# Patient Record
Sex: Male | Born: 1955 | Race: White | Hispanic: No | Marital: Married | Smoking: Former smoker
Health system: Southern US, Community
[De-identification: ages and names within clinical notes are randomized; demographics above are authoritative.]

## PROBLEM LIST (undated history)

## (undated) DIAGNOSIS — N200 Calculus of kidney: Secondary | ICD-10-CM

## (undated) DIAGNOSIS — I10 Essential (primary) hypertension: Secondary | ICD-10-CM

## (undated) DIAGNOSIS — K219 Gastro-esophageal reflux disease without esophagitis: Secondary | ICD-10-CM

## (undated) HISTORY — PX: APPENDECTOMY: SHX54

---

## 2011-05-13 ENCOUNTER — Emergency Department (HOSPITAL_COMMUNITY)
Admission: EM | Admit: 2011-05-13 | Discharge: 2011-05-13 | Disposition: A | Discharge status: Home / Self Care | Payer: No Typology Code available for payment source | Attending: Emergency Medicine | Admitting: Emergency Medicine

## 2011-05-13 ENCOUNTER — Emergency Department (HOSPITAL_COMMUNITY): Payer: No Typology Code available for payment source

## 2011-05-13 ENCOUNTER — Encounter: Payer: Self-pay | Admitting: Emergency Medicine

## 2011-05-13 ENCOUNTER — Other Ambulatory Visit: Payer: Self-pay

## 2011-05-13 DIAGNOSIS — Z87442 Personal history of urinary calculi: Secondary | ICD-10-CM | POA: Insufficient documentation

## 2011-05-13 DIAGNOSIS — R0602 Shortness of breath: Secondary | ICD-10-CM | POA: Insufficient documentation

## 2011-05-13 DIAGNOSIS — R071 Chest pain on breathing: Secondary | ICD-10-CM | POA: Insufficient documentation

## 2011-05-13 DIAGNOSIS — R079 Chest pain, unspecified: Secondary | ICD-10-CM

## 2011-05-13 DIAGNOSIS — K219 Gastro-esophageal reflux disease without esophagitis: Secondary | ICD-10-CM | POA: Insufficient documentation

## 2011-05-13 DIAGNOSIS — M549 Dorsalgia, unspecified: Secondary | ICD-10-CM | POA: Insufficient documentation

## 2011-05-13 DIAGNOSIS — M25519 Pain in unspecified shoulder: Secondary | ICD-10-CM | POA: Insufficient documentation

## 2011-05-13 HISTORY — DX: Gastro-esophageal reflux disease without esophagitis: K21.9

## 2011-05-13 HISTORY — DX: Calculus of kidney: N20.0

## 2011-05-13 MED ORDER — DIAZEPAM 5 MG PO TABS: 5.0000 mg | ORAL_TABLET | Freq: Two times a day (BID) | ORAL | Status: AC

## 2011-05-13 NOTE — ED Notes (Signed)
Patient has had back pain described as pressure for 3 days that radiates to chest, chest is described as being tender, cannot sleep on back, stomach or either side

## 2011-05-13 NOTE — ED Provider Notes (Signed)
History     CSN: [id number] Arrival date & time: 05/13/2011 12:51 PM   First MD Initiated Contact with Patient 05/13/11 1303      Chief Complaint  Patient presents with  . Back Pain    (Consider location/radiation/quality/duration/timing/severity/associated sxs/prior treatment) HPI Comments: Patient presents with 2-3 days of left anterior lateral and posterior chest wall pain.  Patient notes that he feels that his left upper chest is tender to palpation.  He feels that it is swollen although there is no redness and no fevers.  Patient states that the pain goes through to his left shoulder blade.  No known new injuries or lifting or new exercises.  Patient describes intermittent mild shortness of breath but the pain is not worse with taking big breaths.  He notes the pain is worse on sitting straight up or lying down.  He notes that it is not worse with exertion and actually the more he moves the better he feels.  He denies any prior cardiac history.  Patient is a 55 y.o. male presenting with back pain. The history is provided by the patient. No language interpreter was used.  Back Pain  This is a new problem. The current episode started more than 2 days ago. The problem occurs constantly. The problem has not changed since onset.The pain is associated with no known injury. The pain is mild. The symptoms are aggravated by certain positions. Associated symptoms include chest pain. Pertinent negatives include no fever, no headaches and no abdominal pain.    Past Medical History  Diagnosis Date  . Acid reflux   . Kidney stones     Past Surgical History  Procedure Date  . Appendectomy     No family history on file.  History  Substance Use Topics  . Smoking status: Former Games developer  . Smokeless tobacco: Not on file  . Alcohol Use: No      Review of Systems  Constitutional: Negative.  Negative for fever and chills.  HENT: Negative.   Eyes: Negative.  Negative for discharge and  redness.  Respiratory: Positive for shortness of breath. Negative for cough.   Cardiovascular: Positive for chest pain.  Gastrointestinal: Negative.  Negative for nausea, vomiting and abdominal pain.  Genitourinary: Negative.  Negative for hematuria.  Musculoskeletal: Positive for back pain.  Skin: Negative.  Negative for color change and rash.  Neurological: Negative for syncope and headaches.  Hematological: Negative.  Negative for adenopathy.  Psychiatric/Behavioral: Negative.  Negative for confusion.  All other systems reviewed and are negative.    Allergies  Celecoxib  Home Medications   Current Outpatient Rx  Name Route Sig Dispense Refill  . OMEPRAZOLE 20 MG PO CPDR Oral Take 20 mg by mouth daily.        BP 175/91  Pulse 67  Temp(Src) 97 F (36.1 C) (Oral)  Resp 16  Ht 5\' 11"  (1.803 m)  Wt 196 lb 8 oz (89.132 kg)  BMI 27.41 kg/m2  SpO2 99%  Physical Exam  Constitutional: He is oriented to person, place, and time. He appears well-developed and well-nourished.  Non-toxic appearance. He does not have a sickly appearance.  HENT:  Head: Normocephalic and atraumatic.  Eyes: Conjunctivae, EOM and lids are normal. Pupils are equal, round, and reactive to light.  Neck: Trachea normal, normal range of motion and full passive range of motion without pain. Neck supple.  Cardiovascular: Normal rate, regular rhythm and normal heart sounds.   Pulmonary/Chest: Effort normal and breath sounds normal.  No respiratory distress.  Abdominal: Soft. Normal appearance. He exhibits no distension. There is no tenderness. There is no rebound and no CVA tenderness.  Musculoskeletal: Normal range of motion.  Neurological: He is alert and oriented to person, place, and time. He has normal strength.  Skin: Skin is warm, dry and intact. No rash noted.  Psychiatric: He has a normal mood and affect. His behavior is normal. Judgment and thought content normal.    ED Course  Procedures (including  critical care time)  Labs Reviewed - No data to display No results found.   No diagnosis found.    Date: 05/13/2011  Rate: 60  Rhythm: normal sinus rhythm  QRS Axis: normal  Intervals: normal  ST/T Wave abnormalities: normal  Conduction Disutrbances:none  Narrative Interpretation:   Old EKG Reviewed: none available   MDM  Patient's chest x-ray visualized by myself along with the radiologist and found to be normal.  At this point in time I have no clear etiology for the patient's pain beyond a musculoskeletal cause.  He has no findings consistent with infection such as cellulitis or abscess.  He has no crepitus on his exam.  He has no findings for pneumonia.  He has symptoms that are not consistent with ACS and a normal EKG.  He is also low risk for ACS.  Patient has no pneumothorax.  He does not have symptoms consistent with pulmonary embolus.  He does not have symptoms consistent with aortic dissection given the description of his pain and notably does have equal blood pressures in both arms.  Patient will be given a prescription for muscle relaxants to assist him with sleep and has been instructed to followup with his primary care physician next week particularly if the symptoms remain persistent.        Nat Christen, MD 05/13/11 [phone]

## 2013-10-10 ENCOUNTER — Ambulatory Visit
Admission: RE | Admit: 2013-10-10 | Discharge: 2013-10-10 | Disposition: A | Discharge status: Home / Self Care | Payer: No Typology Code available for payment source | Source: Ambulatory Visit | Attending: Family Medicine | Admitting: Family Medicine

## 2013-10-10 ENCOUNTER — Other Ambulatory Visit: Payer: Self-pay | Admitting: Family Medicine

## 2013-10-10 DIAGNOSIS — R079 Chest pain, unspecified: Secondary | ICD-10-CM

## 2013-10-16 ENCOUNTER — Ambulatory Visit (HOSPITAL_COMMUNITY)
Admission: RE | Admit: 2013-10-16 | Discharge: 2013-10-16 | Disposition: A | Discharge status: Home / Self Care | Payer: No Typology Code available for payment source | Source: Ambulatory Visit | Attending: Cardiology | Admitting: Cardiology

## 2013-10-16 ENCOUNTER — Other Ambulatory Visit (HOSPITAL_COMMUNITY): Payer: Self-pay | Admitting: Family Medicine

## 2013-10-16 DIAGNOSIS — R079 Chest pain, unspecified: Secondary | ICD-10-CM | POA: Insufficient documentation

## 2015-01-05 ENCOUNTER — Emergency Department (HOSPITAL_COMMUNITY): Payer: No Typology Code available for payment source

## 2015-01-05 ENCOUNTER — Emergency Department (HOSPITAL_COMMUNITY)
Admission: EM | Admit: 2015-01-05 | Discharge: 2015-01-05 | Disposition: A | Discharge status: Home / Self Care | Payer: No Typology Code available for payment source | Attending: Emergency Medicine | Admitting: Emergency Medicine

## 2015-01-05 ENCOUNTER — Encounter (HOSPITAL_COMMUNITY): Payer: Self-pay | Admitting: *Deleted

## 2015-01-05 DIAGNOSIS — S299XXA Unspecified injury of thorax, initial encounter: Secondary | ICD-10-CM | POA: Diagnosis present

## 2015-01-05 DIAGNOSIS — I10 Essential (primary) hypertension: Secondary | ICD-10-CM | POA: Diagnosis not present

## 2015-01-05 DIAGNOSIS — K219 Gastro-esophageal reflux disease without esophagitis: Secondary | ICD-10-CM | POA: Diagnosis not present

## 2015-01-05 DIAGNOSIS — Z9089 Acquired absence of other organs: Secondary | ICD-10-CM | POA: Insufficient documentation

## 2015-01-05 DIAGNOSIS — S301XXA Contusion of abdominal wall, initial encounter: Secondary | ICD-10-CM | POA: Insufficient documentation

## 2015-01-05 DIAGNOSIS — W108XXA Fall (on) (from) other stairs and steps, initial encounter: Secondary | ICD-10-CM | POA: Diagnosis not present

## 2015-01-05 DIAGNOSIS — Z87442 Personal history of urinary calculi: Secondary | ICD-10-CM | POA: Diagnosis not present

## 2015-01-05 DIAGNOSIS — Z79899 Other long term (current) drug therapy: Secondary | ICD-10-CM | POA: Insufficient documentation

## 2015-01-05 DIAGNOSIS — Y9289 Other specified places as the place of occurrence of the external cause: Secondary | ICD-10-CM | POA: Insufficient documentation

## 2015-01-05 DIAGNOSIS — S20221A Contusion of right back wall of thorax, initial encounter: Secondary | ICD-10-CM | POA: Diagnosis not present

## 2015-01-05 DIAGNOSIS — W19XXXA Unspecified fall, initial encounter: Secondary | ICD-10-CM

## 2015-01-05 DIAGNOSIS — Y998 Other external cause status: Secondary | ICD-10-CM | POA: Diagnosis not present

## 2015-01-05 DIAGNOSIS — Z87891 Personal history of nicotine dependence: Secondary | ICD-10-CM | POA: Diagnosis not present

## 2015-01-05 DIAGNOSIS — Y9301 Activity, walking, marching and hiking: Secondary | ICD-10-CM | POA: Diagnosis not present

## 2015-01-05 DIAGNOSIS — S5011XA Contusion of right forearm, initial encounter: Secondary | ICD-10-CM | POA: Diagnosis not present

## 2015-01-05 DIAGNOSIS — S50811A Abrasion of right forearm, initial encounter: Secondary | ICD-10-CM | POA: Diagnosis not present

## 2015-01-05 DIAGNOSIS — S40021A Contusion of right upper arm, initial encounter: Secondary | ICD-10-CM

## 2015-01-05 HISTORY — DX: Essential (primary) hypertension: I10

## 2015-01-05 MED ORDER — HYDROCODONE-ACETAMINOPHEN 5-325 MG PO TABS: 1.0000 | ORAL_TABLET | Freq: Four times a day (QID) | ORAL | Status: AC | PRN

## 2015-01-05 NOTE — ED Notes (Signed)
Patient states he was walking on wet steps and he slipped and fell down the steps.  He denies loc.  He states it knocked the breath out of him.  Patient has right arm pain and right sided back pain.  Patient denies any sob.  Patient took ibuprofen this morning.

## 2015-01-05 NOTE — Discharge Instructions (Signed)
Contusion °A contusion is a deep bruise. Contusions are the result of an injury that caused bleeding under the skin. The contusion may turn blue, purple, or yellow. Minor injuries will give you a painless contusion, but more severe contusions may stay painful and swollen for a few weeks.  °CAUSES  °A contusion is usually caused by a blow, trauma, or direct force to an area of the body. °SYMPTOMS  °· Swelling and redness of the injured area. °· Bruising of the injured area. °· Tenderness and soreness of the injured area. °· Pain. °DIAGNOSIS  °The diagnosis can be made by taking a history and physical exam. An X-ray, CT scan, or MRI may be needed to determine if there were any associated injuries, such as fractures. °TREATMENT  °Specific treatment will depend on what area of the body was injured. In general, the best treatment for a contusion is resting, icing, elevating, and applying cold compresses to the injured area. Over-the-counter medicines may also be recommended for pain control. Ask your caregiver what the best treatment is for your contusion. °HOME CARE INSTRUCTIONS  °· Put ice on the injured area. °¨ Put ice in a plastic bag. °¨ Place a towel between your skin and the bag. °¨ Leave the ice on for 15-20 minutes, 3-4 times a day, or as directed by your health care provider. °· Only take over-the-counter or prescription medicines for pain, discomfort, or fever as directed by your caregiver. Your caregiver may recommend avoiding anti-inflammatory medicines (aspirin, ibuprofen, and naproxen) for 48 hours because these medicines may increase bruising. °· Rest the injured area. °· If possible, elevate the injured area to reduce swelling. °SEEK IMMEDIATE MEDICAL CARE IF:  °· You have increased bruising or swelling. °· You have pain that is getting worse. °· Your swelling or pain is not relieved with medicines. °MAKE SURE YOU:  °· Understand these instructions. °· Will watch your condition. °· Will get help right  away if you are not doing well or get worse. °Document Released: 02/23/2005 Document Revised: 05/21/2013 Document Reviewed: 03/21/2011 °[employer]® Patient Information ©2015 [employer]. This information is not intended to replace advice given to you by your health care provider. Make sure you discuss any questions you have with your health care provider. ° °

## 2015-01-05 NOTE — ED Provider Notes (Signed)
CSN: [id number]     Arrival date & time 01/05/15  4098 History   First MD Initiated Contact with Patient 01/05/15 [phone]     Chief Complaint  Patient presents with  . Fall  . Back Pain  . Arm Pain     Patient is a 59 y.o. male presenting with fall, back pain, and arm pain. The history is provided by the patient. No language interpreter was used.  Fall  Back Pain Arm Pain   Mr. Godden presents for evaluation of injuries following a fall. He was walking outside on some wet steps and he slipped on the top step and fell onto his back sliding down 7 steps. He denies any head injury or loss of consciousness. He has pain throughout his mid back. He felt like the wind got knocked out of him initially. He denies any chest pain, abdominal pain, vomiting, numbness, weakness. He takes no blood thinners. Symptoms are moderate and constant.  Past Medical History  Diagnosis Date  . Acid reflux   . Kidney stones   . Hypertension    Past Surgical History  Procedure Laterality Date  . Appendectomy     No family history on file. History  Substance Use Topics  . Smoking status: Former Games developer  . Smokeless tobacco: Not on file  . Alcohol Use: No    Review of Systems  Musculoskeletal: Positive for back pain.  All other systems reviewed and are negative.     Allergies  Celebrex  Home Medications   Prior to Admission medications   Medication Sig Start Date End Date Taking? Authorizing Provider  bisoprolol-hydrochlorothiazide (ZIAC) 5-6.25 MG per tablet Take 1 tablet by mouth daily.  01/01/15  Yes Historical Provider, MD  esomeprazole (NEXIUM) 20 MG capsule Take 20 mg by mouth daily at 12 noon.   Yes Historical Provider, MD  ibuprofen (ADVIL,MOTRIN) 200 MG tablet Take 200 mg by mouth every 6 (six) hours as needed for mild pain.   Yes Historical Provider, MD   BP 172/94 mmHg  Pulse 55  Temp(Src) 98.5 F (36.9 C) (Oral)  Resp 18  SpO2 99% Physical Exam  Constitutional: He is oriented to  person, place, and time. He appears well-developed and well-nourished.  HENT:  Head: Normocephalic and atraumatic.  Cardiovascular: Normal rate and regular rhythm.   No murmur heard. Pulmonary/Chest: Effort normal and breath sounds normal. No respiratory distress. He exhibits no tenderness.  Abdominal: Soft. There is no tenderness. There is no rebound and no guarding.  Musculoskeletal:  Abrasion and ecchymosis over the right ulnar forearm. There is abrasion and tenderness over the right flank. There is no discrete bony tenderness over the C, T, L-spine. No bony tenderness over the arm.  Neurological: He is alert and oriented to person, place, and time.  5 out of 5 strength in all 4 extermities  Skin: Skin is warm and dry.  Psychiatric: He has a normal mood and affect. His behavior is normal.  Nursing note and vitals reviewed.   ED Course  Procedures (including critical care time) Labs Review Labs Reviewed - No data to display  Imaging Review Dg Chest 2 View  01/05/2015   CLINICAL DATA:  Slipped and fell.  EXAM: CHEST  2 VIEW  COMPARISON:  10/10/2013  FINDINGS: The heart size and mediastinal contours are within normal limits. Both lungs are clear. The visualized skeletal structures are unremarkable.  IMPRESSION: No active cardiopulmonary disease.   Electronically Signed   By: Veronda Prude.D.  On: 01/05/2015 09:30   Dg Lumbar Spine Complete  01/05/2015   CLINICAL DATA:  Larey Seat down on wet steps, right side back pain  EXAM: LUMBAR SPINE - COMPLETE 4+ VIEW  COMPARISON:  06/24/2010  FINDINGS: Five views of lumbar spine submitted. No acute fracture or subluxation. Mild disc space flattening with anterior spurring at L3-L4 level. Moderate disc space flattening with mild anterior spurring at L5-S1 level.  IMPRESSION: No acute fracture or subluxation. Mild degenerative changes as described above.   Electronically Signed   By: Natasha Mead M.D.   On: 01/05/2015 09:19     EKG Interpretation None       MDM   Final diagnoses:  Fall, initial encounter  Contusion of flank and back, initial encounter  Arm contusion, right, initial encounter    Patient here for evaluation of injuries following a fall. Examination is not consistent with acute fracture or neurologic injury. Patient does have abrasion to right flank, no gross hematuria. No abdominal tenderness or chest wall tenderness. Discussed with patient home care for contusions following a fall with PCP follow-up and return precautions.  Tilden Fossa, MD 01/05/15 1020

## 2015-10-30 ENCOUNTER — Ambulatory Visit
Admission: RE | Admit: 2015-10-30 | Discharge: 2015-10-30 | Disposition: A | Discharge status: Home / Self Care | Payer: No Typology Code available for payment source | Source: Ambulatory Visit | Attending: Family Medicine | Admitting: Family Medicine

## 2015-10-30 ENCOUNTER — Other Ambulatory Visit: Payer: Self-pay | Admitting: Family Medicine

## 2015-10-30 DIAGNOSIS — T1490XA Injury, unspecified, initial encounter: Secondary | ICD-10-CM

## 2016-12-27 IMAGING — CR DG FINGER MIDDLE 2+V*R*
3 series · 3 of 3 positions shown · non-contrast
Comparison: None.

CLINICAL DATA: Injury

EXAM:
RIGHT MIDDLE FINGER 2+V

[w finger pa right]
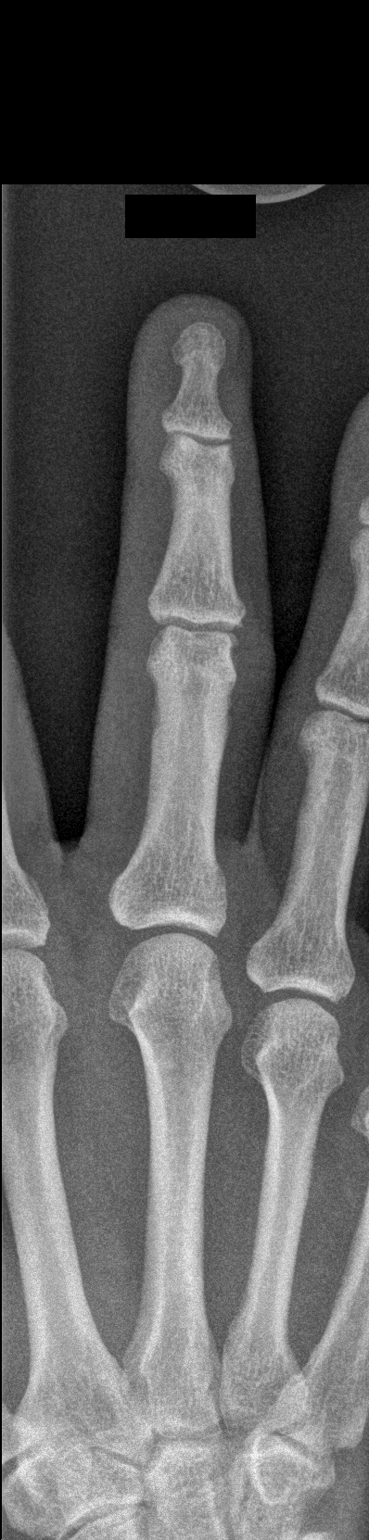

[w finger obl right]
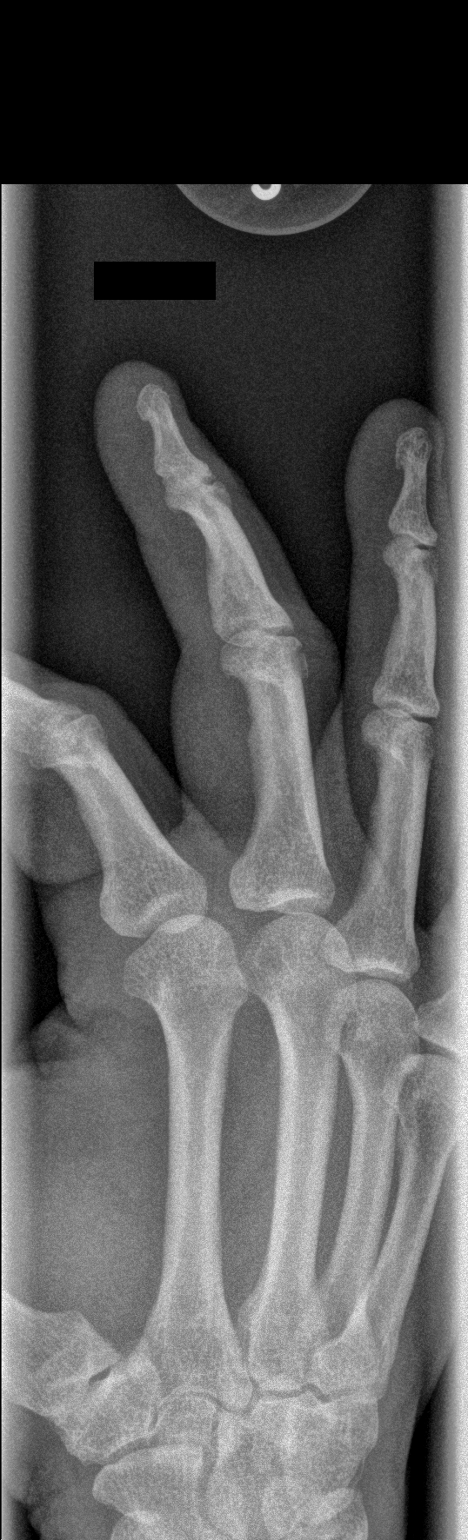

[w finger lat right]
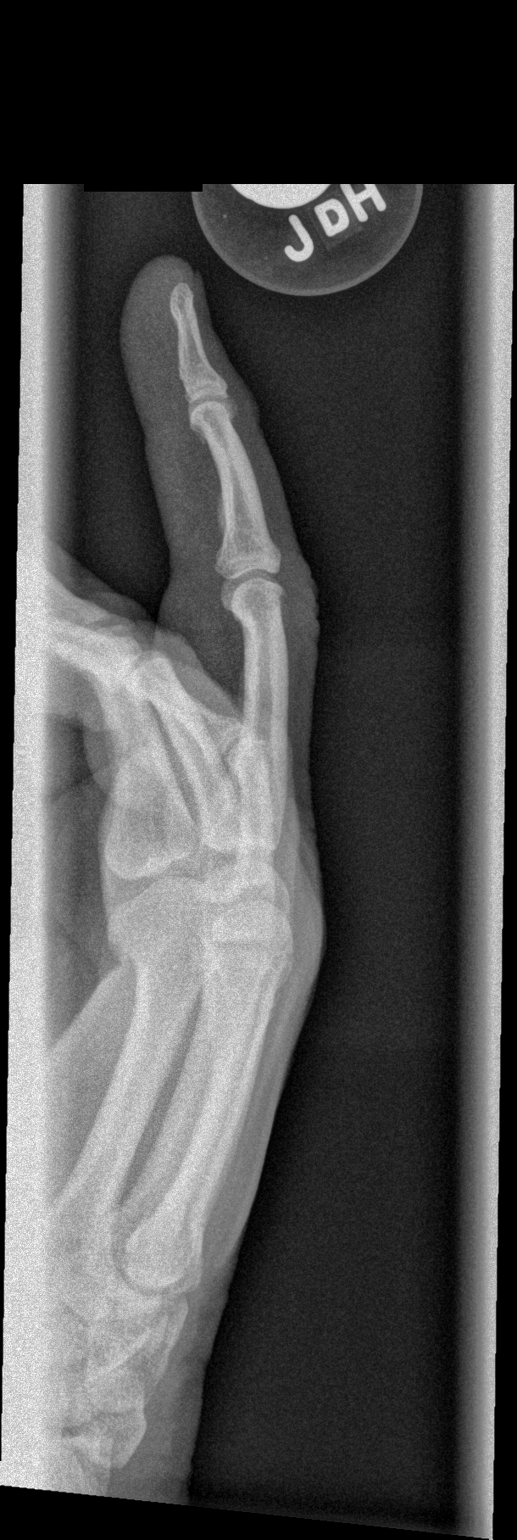

[3 of 3 positions shown; findings below may reference images not displayed]

FINDINGS: Small avulsion fracture of the distal and dorsal aspect of the
proximal third phalanx. This is only seen on the oblique view but
appears to be an acute fracture. No arthropathy.
IMPRESSION: Avulsion fracture distal aspect of the proximal third phalanx.

## 2020-08-31 LAB — EXTERNAL GENERIC LAB PROCEDURE: COLOGUARD: NEGATIVE

## 2022-11-08 ENCOUNTER — Other Ambulatory Visit (HOSPITAL_COMMUNITY): Payer: Self-pay | Admitting: Family Medicine

## 2022-11-08 DIAGNOSIS — I10 Essential (primary) hypertension: Secondary | ICD-10-CM

## 2022-11-08 DIAGNOSIS — N1831 Chronic kidney disease, stage 3a: Secondary | ICD-10-CM

## 2022-11-17 ENCOUNTER — Ambulatory Visit (HOSPITAL_COMMUNITY)
Admission: RE | Admit: 2022-11-17 | Discharge: 2022-11-17 | Disposition: A | Discharge status: Home / Self Care | Payer: Medicare Other | Source: Ambulatory Visit | Attending: Cardiology | Admitting: Cardiology

## 2022-11-17 DIAGNOSIS — N1831 Chronic kidney disease, stage 3a: Secondary | ICD-10-CM | POA: Insufficient documentation

## 2022-11-17 DIAGNOSIS — I10 Essential (primary) hypertension: Secondary | ICD-10-CM | POA: Diagnosis not present

## 2023-03-01 ENCOUNTER — Other Ambulatory Visit: Payer: Self-pay | Admitting: Family Medicine

## 2023-03-01 DIAGNOSIS — R1011 Right upper quadrant pain: Secondary | ICD-10-CM

## 2023-03-01 DIAGNOSIS — R1013 Epigastric pain: Secondary | ICD-10-CM

## 2023-03-10 ENCOUNTER — Ambulatory Visit
Admission: RE | Admit: 2023-03-10 | Discharge: 2023-03-10 | Disposition: A | Discharge status: Home / Self Care | Payer: Medicare Other | Source: Ambulatory Visit | Attending: Family Medicine | Admitting: Family Medicine

## 2023-03-10 DIAGNOSIS — R1011 Right upper quadrant pain: Secondary | ICD-10-CM

## 2023-03-10 DIAGNOSIS — R1013 Epigastric pain: Secondary | ICD-10-CM

## 2023-04-10 ENCOUNTER — Other Ambulatory Visit (HOSPITAL_BASED_OUTPATIENT_CLINIC_OR_DEPARTMENT_OTHER): Payer: Self-pay | Admitting: Family Medicine

## 2023-04-10 DIAGNOSIS — Z136 Encounter for screening for cardiovascular disorders: Secondary | ICD-10-CM

## 2023-04-21 ENCOUNTER — Ambulatory Visit (HOSPITAL_COMMUNITY)
Admission: RE | Admit: 2023-04-21 | Discharge: 2023-04-21 | Disposition: A | Discharge status: Home / Self Care | Payer: Medicare Other | Source: Ambulatory Visit | Attending: Cardiovascular Disease | Admitting: Cardiovascular Disease

## 2023-04-21 DIAGNOSIS — Z136 Encounter for screening for cardiovascular disorders: Secondary | ICD-10-CM | POA: Insufficient documentation

## 2023-04-21 DIAGNOSIS — Z87891 Personal history of nicotine dependence: Secondary | ICD-10-CM | POA: Diagnosis present

## 2023-08-18 ENCOUNTER — Other Ambulatory Visit: Payer: Self-pay | Admitting: Family Medicine

## 2023-08-18 ENCOUNTER — Ambulatory Visit
Admission: RE | Admit: 2023-08-18 | Discharge: 2023-08-18 | Disposition: A | Discharge status: Home / Self Care | Source: Ambulatory Visit | Attending: Family Medicine | Admitting: Family Medicine

## 2023-08-18 DIAGNOSIS — M5416 Radiculopathy, lumbar region: Secondary | ICD-10-CM
